# Patient Record
Sex: Female | Born: 1990 | Race: Black or African American | Hispanic: No | Marital: Single | Smoking: Never smoker
Health system: Southern US, Community
[De-identification: ages and names within clinical notes are randomized; demographics above are authoritative.]

---
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
## 2013-04-05 ENCOUNTER — Encounter (HOSPITAL_COMMUNITY): Payer: Self-pay

## 2013-04-05 ENCOUNTER — Emergency Department (INDEPENDENT_AMBULATORY_CARE_PROVIDER_SITE_OTHER)
Admission: EM | Admit: 2013-04-05 | Discharge: 2013-04-05 | Disposition: A | Discharge status: Home / Self Care | Payer: 59 | Source: Home / Self Care | Attending: Family Medicine | Admitting: Family Medicine

## 2013-04-05 DIAGNOSIS — N390 Urinary tract infection, site not specified: Secondary | ICD-10-CM

## 2013-04-05 LAB — POCT URINALYSIS DIP (DEVICE)
Ketones, ur: NEGATIVE mg/dL
Nitrite: NEGATIVE
Protein, ur: 100 mg/dL — AB
Urobilinogen, UA: 0.2 mg/dL (ref 0.0–1.0)
pH: 6.5 (ref 5.0–8.0)

## 2013-04-05 MED ORDER — PHENAZOPYRIDINE HCL 200 MG PO TABS: 200.0000 mg | ORAL_TABLET | Freq: Three times a day (TID) | ORAL | Status: DC

## 2013-04-05 MED ORDER — NITROFURANTOIN MONOHYD MACRO 100 MG PO CAPS: 100.0000 mg | ORAL_CAPSULE | Freq: Two times a day (BID) | ORAL | Status: DC

## 2013-04-05 NOTE — ED Notes (Signed)
C/o pain lower abdominal area, pain w urination; started menses last night/early this AM

## 2013-04-05 NOTE — ED Provider Notes (Signed)
History     CSN: [id number]  Arrival date & time 04/05/13  1156   First MD Initiated Contact with Patient 04/05/13 1231      Chief Complaint  Patient presents with  . Dysuria    HPI: Patient is a 22 y.o. female presenting with dysuria. The history is provided by the patient.  Dysuria  This is a new problem. The current episode started 2 days ago. The problem occurs every urination. The problem has been gradually worsening. The quality of the pain is described as burning. The pain is at a severity of 9/10. The pain is severe. There has been no fever.  Pt reports a 2 day h/o burning and frequency w/ urination. The dysuria has become quite severe. Also reports some associated LBP. Denies fever. Pt is currently on her menses but denies any unusual vaginal d/c or other GYN symptoms prior to onset of menses.   History reviewed. No pertinent past medical history.  History reviewed. No pertinent past surgical history.  History reviewed. No pertinent family history.  History  Substance Use Topics  . Smoking status: Never Smoker   . Smokeless tobacco: Not on file  . Alcohol Use: No    OB History   Grav Para Term Preterm Abortions TAB SAB Ect Mult Living                  Review of Systems  Genitourinary: Positive for dysuria.  All other systems reviewed and are negative.    Allergies  Review of patient's allergies indicates no known allergies.  Home Medications  No current outpatient prescriptions on file.  BP 109/61  Pulse 88  Temp(Src) 98.2 F (36.8 C) (Oral)  Resp 18  SpO2 98%  LMP 04/05/2013  Physical Exam  Constitutional: She is oriented to person, place, and time. She appears well-developed and well-nourished.  HENT:  Head: Normocephalic and atraumatic.  Eyes: Conjunctivae are normal.  Neck: Neck supple.  Cardiovascular: Normal rate.   Pulmonary/Chest: Effort normal.  Musculoskeletal: Normal range of motion.  Neurological: She is alert and oriented to  person, place, and time.  Skin: Skin is warm and dry.  Psychiatric: She has a normal mood and affect.    ED Course  Procedures (including critical care time)  Labs Reviewed - No data to display No results found.   No diagnosis found.    MDM  2 day h/o UTI sx's. No vag d/c or other GYN c/o's. Positive U/A. Will treat w/ Macrobid BID x 7 days and Pyridium TID x 2 days PRN. Referral to [facility] provided as pt wants to get established for annual exam and consultation for birth control. Encouraged to use condoms until follow up. Pt agreeable.        Leanne Chang, NP 04/05/13 [phone]

## 2013-04-05 NOTE — ED Provider Notes (Signed)
Medical screening examination/treatment/procedure(s) were performed by non-physician practitioner and as supervising physician I was immediately available for consultation/collaboration.   MORENO-COLL,Jessamyn Watterson; MD  Rosalin Buster Moreno-Coll, MD 04/05/13 1515 

## 2014-05-11 ENCOUNTER — Encounter (HOSPITAL_COMMUNITY): Payer: Self-pay | Admitting: Emergency Medicine

## 2014-05-11 ENCOUNTER — Emergency Department (INDEPENDENT_AMBULATORY_CARE_PROVIDER_SITE_OTHER)
Admission: EM | Admit: 2014-05-11 | Discharge: 2014-05-11 | Disposition: A | Discharge status: Home / Self Care | Payer: 59 | Source: Home / Self Care | Attending: Family Medicine | Admitting: Family Medicine

## 2014-05-11 ENCOUNTER — Other Ambulatory Visit (HOSPITAL_COMMUNITY)
Admission: RE | Admit: 2014-05-11 | Discharge: 2014-05-11 | Disposition: A | Discharge status: Home / Self Care | Payer: 59 | Source: Ambulatory Visit | Attending: Family Medicine | Admitting: Family Medicine

## 2014-05-11 DIAGNOSIS — N760 Acute vaginitis: Secondary | ICD-10-CM

## 2014-05-11 DIAGNOSIS — Z113 Encounter for screening for infections with a predominantly sexual mode of transmission: Secondary | ICD-10-CM | POA: Insufficient documentation

## 2014-05-11 LAB — POCT PREGNANCY, URINE: PREG TEST UR: NEGATIVE

## 2014-05-11 MED ORDER — LIDOCAINE HCL (PF) 1 % IJ SOLN
INTRAMUSCULAR | Status: AC
Start: 2014-05-11 — End: 2014-05-11
  Filled 2014-05-11: qty 5

## 2014-05-11 MED ORDER — AZITHROMYCIN 250 MG PO TABS
ORAL_TABLET | ORAL | Status: AC
  Filled 2014-05-11: qty 4

## 2014-05-11 MED ORDER — CEFTRIAXONE SODIUM 250 MG IJ SOLR
250.0000 mg | Freq: Once | INTRAMUSCULAR | Status: AC
  Administered 2014-05-11: 250 mg via INTRAMUSCULAR

## 2014-05-11 MED ORDER — AZITHROMYCIN 250 MG PO TABS
1000.0000 mg | ORAL_TABLET | Freq: Once | ORAL | Status: AC
  Administered 2014-05-11: 1000 mg via ORAL

## 2014-05-11 MED ORDER — CEFTRIAXONE SODIUM 250 MG IJ SOLR
INTRAMUSCULAR | Status: AC
  Filled 2014-05-11: qty 250

## 2014-05-11 NOTE — ED Provider Notes (Addendum)
Belinda Bartlett is a 23 y.o. female who presents to Urgent Care today for STD exposure. Patient's boyfriend recently developed penile discharge. She's concerned she's been exposed to STD. She denies any symptoms. No vaginal discharge pain or pressure dysuria fevers chills nausea vomiting or diarrhea. She feels well otherwise.   History reviewed. No pertinent past medical history. History  Substance Use Topics  . Smoking status: Never Smoker   . Smokeless tobacco: Not on file  . Alcohol Use: No   ROS as above Medications: No current facility-administered medications for this encounter.   No current outpatient prescriptions on file.    Exam:  BP 115/73  Pulse 73  Temp(Src) 99.2 F (37.3 C) (Oral)  Resp 12  SpO2 100%  LMP 04/10/2014 Gen: Well NAD Lungs: Normal work of breathing. CTABL Heart: RRR no MRG Abd: NABS, Soft. NT, ND Exts: Brisk capillary refill, warm and well perfused.   Results for orders placed during the hospital encounter of 05/11/14 (from the past 24 hour(s))  POCT PREGNANCY, URINE     Status: None   Collection Time    05/11/14  2:42 PM      Result Value Ref Range   Preg Test, Ur NEGATIVE  NEGATIVE   No results found.  Assessment and Plan: 23 y.o. female with STD exposure. Urine cytology pending. Empiric treatment with ceftriaxone and azithromycin. HIV and RPR also pending.  Discussed warning signs or symptoms. Please see discharge instructions. Patient expresses understanding.    Rodolph Bong, MD 05/11/14 1511  Rodolph Bong, MD 05/14/14 [phone]

## 2014-05-11 NOTE — Discharge Instructions (Signed)
Thank you for coming in today. We will call you if anything comes back positive.
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
## 2014-05-11 NOTE — ED Notes (Signed)
Pt given injection will discharge at 3:20 p.m.   Mw,cma

## 2014-05-12 LAB — RPR

## 2014-05-12 LAB — HIV ANTIBODY (ROUTINE TESTING W REFLEX): HIV: NONREACTIVE

## 2014-05-13 NOTE — ED Notes (Signed)
GC pos., Chlamydia and Trich neg., HIV/RPR non-reactive.  Pt. adequately treated with Rocephin and Zithromax given.  Needs notified. Desiree Lucy Ramie Erman 05/13/2014

## 2014-05-14 ENCOUNTER — Telehealth (HOSPITAL_COMMUNITY): Payer: Self-pay | Admitting: *Deleted

## 2014-05-14 NOTE — ED Notes (Addendum)
I called and left a message to call.  Call 1.  [facility]HS form completed and faxed to the [facility]. Belinda Bartlett 05/14/2014 Pt. called back.  Pt. verified x 2 and given results.  Pt. told she was adequately treated with the Rocephin and Zithromax she got here.  Pt. instructed to notify their partner, no sex for 1 week and to practice safe sex. Pt. told they can get HIV rechecked in 6 mos. at the [facility]. STD clinic, by appointment. 05/14/2014

## 2024-02-13 ENCOUNTER — Encounter (HOSPITAL_BASED_OUTPATIENT_CLINIC_OR_DEPARTMENT_OTHER): Payer: Self-pay

## 2024-02-13 ENCOUNTER — Ambulatory Visit (HOSPITAL_BASED_OUTPATIENT_CLINIC_OR_DEPARTMENT_OTHER)
Admission: RE | Admit: 2024-02-13 | Discharge: 2024-02-13 | Disposition: A | Discharge status: Home / Self Care | Source: Ambulatory Visit | Attending: Family Medicine | Admitting: Family Medicine

## 2024-02-13 VITALS — BP 144/90 | HR 103 | Temp 99.0°F | Resp 20

## 2024-02-13 DIAGNOSIS — Z202 Contact with and (suspected) exposure to infections with a predominantly sexual mode of transmission: Secondary | ICD-10-CM | POA: Insufficient documentation

## 2024-02-13 DIAGNOSIS — Z113 Encounter for screening for infections with a predominantly sexual mode of transmission: Secondary | ICD-10-CM | POA: Diagnosis present

## 2024-02-13 DIAGNOSIS — Z711 Person with feared health complaint in whom no diagnosis is made: Secondary | ICD-10-CM | POA: Diagnosis present

## 2024-02-13 NOTE — ED Provider Notes (Signed)
 [facility]    CSN: [id number] Arrival date & time: 02/13/24  1353      History   Chief Complaint Chief Complaint  Patient presents with   Exposure to STD    Need to get tested - Entered by patient    HPI Belinda Bartlett is a 33 y.o. female.   33 year old female presents today with concern for STD.  Did have unprotected sex approximately 1 month ago. Denies any symptoms to include vaginal discharge, urinary symptoms, abdominal pain, flank pain, back pain, fevers or chills. Denies any sores or lesions   Exposure to STD    History reviewed. No pertinent past medical history.  There are no active problems to display for this patient.   History reviewed. No pertinent surgical history.  OB History   No obstetric history on file.      Home Medications    Prior to Admission medications   Not on File    Family History History reviewed. No pertinent family history.  Social History Social History   Tobacco Use   Smoking status: Never  Substance Use Topics   Alcohol use: No   Drug use: Yes    Types: Marijuana     Allergies   Patient has no known allergies.   Review of Systems Review of Systems  Genitourinary:  Negative for dysuria, flank pain, genital sores, hematuria, menstrual problem, pelvic pain, vaginal bleeding, vaginal discharge and vaginal pain.     Physical Exam Triage Vital Signs ED Triage Vitals  Encounter Vitals Group     BP 02/13/24 1404 (!) 144/90     Systolic BP Percentile --      Diastolic BP Percentile --      Pulse Rate 02/13/24 1404 (!) 103     Resp 02/13/24 1404 20     Temp 02/13/24 1404 99 F (37.2 C)     Temp Source 02/13/24 1404 Oral     SpO2 02/13/24 1404 99 %     Weight --      Height --      Head Circumference --      Peak Flow --      Pain Score 02/13/24 1405 0     Pain Loc --      Pain Education --      Exclude from Growth Chart --    No data found.  Updated Vital Signs BP (!) 144/90 (BP  Location: Right Arm)   Pulse (!) 103   Temp 99 F (37.2 C) (Oral)   Resp 20   LMP 01/25/2024   SpO2 99%   Visual Acuity Right Eye Distance:   Left Eye Distance:   Bilateral Distance:    Right Eye Near:   Left Eye Near:    Bilateral Near:     Physical Exam Vitals and nursing note reviewed.  Constitutional:      Appearance: Normal appearance.  Pulmonary:     Effort: Pulmonary effort is normal.  Neurological:     Mental Status: She is alert.  Psychiatric:        Mood and Affect: Mood normal.      UC Treatments / Results  Labs (all labs ordered are listed, but only abnormal results are displayed) Labs Reviewed  CERVICOVAGINAL ANCILLARY ONLY    EKG   Radiology No results found.  Procedures Procedures (including critical care time)  Medications Ordered in UC Medications - No data to display  Initial Impression / Assessment and Plan / UC  Course  I have reviewed the triage vital signs and the nursing notes.  Pertinent labs & imaging results that were available during my care of the patient were reviewed by me and considered in my medical decision making (see chart for details).     Concerned about STD in female-  STD swab done today.  Results pending.  Will call with any positive results. Final Clinical Impressions(s) / UC Diagnoses   Final diagnoses:  Concern about STD in female without diagnosis     Discharge Instructions      We will call you with any positive results of your swab.  If you have not heard anything in a few days please call we will get the results over the phone    ED Prescriptions   None    PDMP not reviewed this encounter.   Janace Aris, FNP 02/13/24 1423

## 2024-02-13 NOTE — Discharge Instructions (Signed)
 We will call you with any positive results of your swab.  If you have not heard anything in a few days please call we will get the results over the phone

## 2024-02-13 NOTE — ED Triage Notes (Signed)
 Unprotected sex 1 month ago. Does not have any symptoms nor has her partner advised her of them experiencing any symptoms.

## 2024-02-14 ENCOUNTER — Telehealth: Payer: Self-pay

## 2024-02-14 ENCOUNTER — Other Ambulatory Visit (HOSPITAL_BASED_OUTPATIENT_CLINIC_OR_DEPARTMENT_OTHER): Payer: Self-pay

## 2024-02-14 LAB — CERVICOVAGINAL ANCILLARY ONLY
Bacterial Vaginitis (gardnerella): POSITIVE — AB
Candida Glabrata: POSITIVE — AB
Candida Vaginitis: NEGATIVE
Chlamydia: NEGATIVE
Comment: NEGATIVE
Comment: NEGATIVE
Comment: NEGATIVE
Comment: NEGATIVE
Comment: NEGATIVE
Comment: NORMAL
Neisseria Gonorrhea: NEGATIVE
Trichomonas: POSITIVE — AB

## 2024-02-14 MED ORDER — METRONIDAZOLE 500 MG PO TABS
500.0000 mg | ORAL_TABLET | Freq: Two times a day (BID) | ORAL | 0 refills | Status: AC
  Filled 2024-02-14: qty 14, 7d supply, fill #0

## 2024-02-14 NOTE — Telephone Encounter (Signed)
 Per protocol, pt requires tx with metronidazole. Reviewed with patient, verified pharmacy, prescription sent.

## 2024-03-21 ENCOUNTER — Ambulatory Visit (HOSPITAL_BASED_OUTPATIENT_CLINIC_OR_DEPARTMENT_OTHER): Admitting: Student

## 2024-04-16 ENCOUNTER — Other Ambulatory Visit (HOSPITAL_BASED_OUTPATIENT_CLINIC_OR_DEPARTMENT_OTHER): Payer: Self-pay

## 2024-04-25 ENCOUNTER — Other Ambulatory Visit (HOSPITAL_BASED_OUTPATIENT_CLINIC_OR_DEPARTMENT_OTHER): Payer: Self-pay

## 2024-05-02 ENCOUNTER — Other Ambulatory Visit (HOSPITAL_BASED_OUTPATIENT_CLINIC_OR_DEPARTMENT_OTHER): Payer: Self-pay

## 2024-05-06 ENCOUNTER — Other Ambulatory Visit (HOSPITAL_BASED_OUTPATIENT_CLINIC_OR_DEPARTMENT_OTHER): Payer: Self-pay

## 2024-05-16 ENCOUNTER — Other Ambulatory Visit (HOSPITAL_BASED_OUTPATIENT_CLINIC_OR_DEPARTMENT_OTHER): Payer: Self-pay
